# Patient Record
Sex: Male | Born: 1990 | Race: Black or African American | Hispanic: No | Marital: Married
Health system: Southern US, Community
[De-identification: ages and names within clinical notes are randomized; demographics above are authoritative.]

---
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
## 2019-08-21 ENCOUNTER — Ambulatory Visit: Payer: BC Managed Care – PPO | Attending: Internal Medicine

## 2019-08-21 DIAGNOSIS — Z23 Encounter for immunization: Secondary | ICD-10-CM

## 2019-08-21 NOTE — Progress Notes (Signed)
   Covid-[facility] Vaccination [facility]  Name:  Douglas Carey    MRN: [id number] DOB: [birth year]  08/21/2019  Mr. Route was observed post Covid-19 immunization for 15 minutes without incident. He was provided with Vaccine Information Sheet and instruction to access the V-Safe system.   [name] was instructed to call 911 with any severe reactions post vaccine: Marland Kitchen Difficulty breathing  . Swelling of face and throat  . A fast heartbeat  . A bad rash all over body  . Dizziness and weakness   Immunizations Administered    Name Date Dose VIS Date Route   Pfizer COVID-19 Vaccine 08/21/2019  1:06 PM 0.3 mL 05/06/2019 Intramuscular   Manufacturer: [employer], [employer]   Lot: [id number]   NDC: [address]-8

## 2019-09-20 ENCOUNTER — Ambulatory Visit: Payer: BC Managed Care – PPO | Attending: Internal Medicine

## 2019-09-20 DIAGNOSIS — Z23 Encounter for immunization: Secondary | ICD-10-CM

## 2019-09-20 NOTE — Progress Notes (Signed)
   Covid-19 Vaccination Clinic  Name:  Rochelle Nephew    MRN: [id number] DOB: Jun 06, 1990  09/20/2019  Mr. Brallier was observed post Covid-19 immunization for 15 minutes without incident. He was provided with Vaccine Information Sheet and instruction to access the V-Safe system.   Mr. Rathbun was instructed to call 911 with any severe reactions post vaccine: Marland Kitchen Difficulty breathing  . Swelling of face and throat  . A fast heartbeat  . A bad rash all over body  . Dizziness and weakness   Immunizations Administered    Name Date Dose VIS Date Route   Pfizer COVID-19 Vaccine 09/20/2019  8:15 AM 0.3 mL 07/20/2018 Intramuscular   Manufacturer: [employer], [employer]   Lot: [id number]   NDC: [address]-3
